# Patient Record
Sex: Male | Born: 1970 | Race: Black or African American | Hispanic: No | Marital: Single | Smoking: Current every day smoker
Health system: Southern US, Community
[De-identification: ages and names within clinical notes are randomized; demographics above are authoritative.]

---

## 2008-10-04 ENCOUNTER — Emergency Department (HOSPITAL_COMMUNITY): Admission: EM | Admit: 2008-10-04 | Discharge: 2008-10-04 | Payer: Self-pay | Admitting: Emergency Medicine

## 2009-12-08 ENCOUNTER — Emergency Department (HOSPITAL_COMMUNITY): Admission: EM | Admit: 2009-12-08 | Discharge: 2009-12-08 | Payer: Self-pay | Admitting: Emergency Medicine

## 2010-08-17 ENCOUNTER — Emergency Department (HOSPITAL_COMMUNITY)
Admission: EM | Admit: 2010-08-17 | Discharge: 2010-08-17 | Disposition: A | Discharge status: Home / Self Care | Payer: Self-pay | Attending: Emergency Medicine | Admitting: Emergency Medicine

## 2010-08-17 DIAGNOSIS — L02818 Cutaneous abscess of other sites: Secondary | ICD-10-CM | POA: Insufficient documentation

## 2010-08-19 LAB — WOUND CULTURE: Gram Stain: NONE SEEN

## 2010-08-20 ENCOUNTER — Inpatient Hospital Stay (INDEPENDENT_AMBULATORY_CARE_PROVIDER_SITE_OTHER)
Admission: RE | Admit: 2010-08-20 | Discharge: 2010-08-20 | Disposition: A | Discharge status: Home / Self Care | Payer: Self-pay | Source: Ambulatory Visit

## 2010-08-20 DIAGNOSIS — N6489 Other specified disorders of breast: Secondary | ICD-10-CM

## 2010-08-22 ENCOUNTER — Inpatient Hospital Stay (HOSPITAL_COMMUNITY)
Admission: RE | Admit: 2010-08-22 | Discharge: 2010-08-22 | Disposition: A | Discharge status: Home / Self Care | Payer: Self-pay | Source: Ambulatory Visit | Attending: Family Medicine | Admitting: Family Medicine

## 2012-12-07 ENCOUNTER — Encounter (HOSPITAL_COMMUNITY): Payer: Self-pay | Admitting: Emergency Medicine

## 2012-12-07 ENCOUNTER — Emergency Department (HOSPITAL_COMMUNITY)
Admission: EM | Admit: 2012-12-07 | Discharge: 2012-12-07 | Disposition: A | Discharge status: Home / Self Care | Payer: Self-pay | Attending: Emergency Medicine | Admitting: Emergency Medicine

## 2012-12-07 DIAGNOSIS — L239 Allergic contact dermatitis, unspecified cause: Secondary | ICD-10-CM

## 2012-12-07 DIAGNOSIS — F17200 Nicotine dependence, unspecified, uncomplicated: Secondary | ICD-10-CM | POA: Insufficient documentation

## 2012-12-07 DIAGNOSIS — L258 Unspecified contact dermatitis due to other agents: Secondary | ICD-10-CM | POA: Insufficient documentation

## 2012-12-07 DIAGNOSIS — L03811 Cellulitis of head [any part, except face]: Secondary | ICD-10-CM

## 2012-12-07 DIAGNOSIS — L02818 Cutaneous abscess of other sites: Secondary | ICD-10-CM | POA: Insufficient documentation

## 2012-12-07 MED ORDER — CEPHALEXIN 250 MG PO CAPS: 250.0000 mg | ORAL_CAPSULE | Freq: Four times a day (QID) | ORAL | Status: DC

## 2012-12-07 MED ORDER — PREDNISONE 5 MG PO TABS: 20.0000 mg | ORAL_TABLET | Freq: Every day | ORAL | Status: DC

## 2012-12-07 NOTE — ED Notes (Signed)
Pt states he had [employer] hair dye applied to his hair about 1.5 weeks ago then immediately braided his hair. Started breaking out with rash to scalp 4-5 days later. Rash is painful.

## 2012-12-07 NOTE — ED Provider Notes (Signed)
Medical screening examination/treatment/procedure(s) were performed by non-physician practitioner and as supervising physician I was immediately available for consultation/collaboration.  Dione Booze, MD 12/07/12 1158

## 2012-12-07 NOTE — ED Provider Notes (Signed)
History     CSN: [id number]  Arrival date & time 12/07/12  4098   First MD Initiated Contact with Patient 12/07/12 [phone]      No chief complaint on file.   (Consider location/radiation/quality/duration/timing/severity/associated sxs/prior treatment) HPI  Patient to the ED with complaints of allergic reaction to hair dye that happened almost 2 weeks ago. The rash developed 3-4 days afterwards diffusely all over his scalp. He admits to scratching is a significant amount and now it is hurting and draining fluids. He denies fevers or large bumps to the area. No weakness, n/v/d. He is otherwise healthy. nad vss  History reviewed. No pertinent past medical history.  History reviewed. No pertinent past surgical history.  No family history on file.  History  Substance Use Topics  . Smoking status: Current Every Day Smoker  . Smokeless tobacco: Not on file  . Alcohol Use: Yes      Review of Systems  Skin: Positive for rash.  All other systems reviewed and are negative.    Allergies  Review of patient's allergies indicates no known allergies.  Home Medications   Current Outpatient Rx  Name  Route  Sig  Dispense  Refill  . DiphenhydrAMINE HCl (BENADRYL PO)   Oral   Take 2 tablets by mouth every 6 (six) hours as needed (seasonal allergies).         . cephALEXin (KEFLEX) 250 MG capsule   Oral   Take 1 capsule (250 mg total) by mouth 4 (four) times daily.   28 capsule   0   . predniSONE (DELTASONE) 5 MG tablet   Oral   Take 4 tablets (20 mg total) by mouth daily.   42 tablet   0     12 tabs on day 1, 10 tabs on day 2, 8 tabs on day  ...     BP 124/84  Pulse 83  Temp(Src) 97.8 F (36.6 C) (Oral)  SpO2 97%  Physical Exam  Nursing note and vitals reviewed. Constitutional: He appears well-developed and well-nourished. No distress.  HENT:  Head: Normocephalic and atraumatic.  Diffuse atopic dermatitis to entire scalp with areas of plaque build up. Some of the  lesions are draining whitish discharged. No frank abscess.  Eyes: Pupils are equal, round, and reactive to light.  Neck: Normal range of motion. Neck supple.  Cardiovascular: Normal rate and regular rhythm.   Pulmonary/Chest: Effort normal.  Abdominal: Soft.  Neurological: He is alert.  Skin: Skin is warm and dry.    ED Course  Procedures (including critical care time)  Labs Reviewed - No data to display No results found.   1. Eczema, allergic   2. Cellulitis of head or scalp       MDM  Rx Prednisone for allergic reaction and keflex to secondary infection.  Fu with dermatology  Pt has been advised of the symptoms that warrant their return to the ED. Patient has voiced understanding and has agreed to follow-up with the PCP or specialist.         Dorthula Matas, PA-C 12/07/12 [phone]

## 2013-05-27 ENCOUNTER — Emergency Department (HOSPITAL_COMMUNITY)
Admission: EM | Admit: 2013-05-27 | Discharge: 2013-05-27 | Disposition: A | Discharge status: Home / Self Care | Payer: Self-pay | Attending: Emergency Medicine | Admitting: Emergency Medicine

## 2013-05-27 ENCOUNTER — Encounter (HOSPITAL_COMMUNITY): Payer: Self-pay | Admitting: Emergency Medicine

## 2013-05-27 DIAGNOSIS — K089 Disorder of teeth and supporting structures, unspecified: Secondary | ICD-10-CM | POA: Insufficient documentation

## 2013-05-27 DIAGNOSIS — F17200 Nicotine dependence, unspecified, uncomplicated: Secondary | ICD-10-CM | POA: Insufficient documentation

## 2013-05-27 DIAGNOSIS — G479 Sleep disorder, unspecified: Secondary | ICD-10-CM | POA: Insufficient documentation

## 2013-05-27 DIAGNOSIS — K0889 Other specified disorders of teeth and supporting structures: Secondary | ICD-10-CM

## 2013-05-27 MED ORDER — OXYCODONE-ACETAMINOPHEN 5-325 MG PO TABS: 1.0000 | ORAL_TABLET | Freq: Three times a day (TID) | ORAL | Status: DC | PRN

## 2013-05-27 MED ORDER — PENICILLIN V POTASSIUM 500 MG PO TABS: 500.0000 mg | ORAL_TABLET | Freq: Four times a day (QID) | ORAL | Status: AC

## 2013-05-27 NOTE — ED Notes (Signed)
C/O LEFT LOWER MOLAR PAIN X 2 DAYS.

## 2013-05-27 NOTE — ED Provider Notes (Signed)
CSN: [id number]     Arrival date & time 05/27/13  1025 History  This chart was scribed for non-physician practitioner working with Audree Camel, MD by Ashley Jacobs, ED scribe. This patient was seen in room TR05C/TR05C and the patient's care was started at 12:24 PM.  First MD Initiated Contact with Patient 05/27/13 1037     Chief Complaint  Patient presents with  . Dental Pain   (Consider location/radiation/quality/duration/timing/severity/associated sxs/prior Treatment) The history is provided by medical records and the patient. No language interpreter was used.   HPI Comments: Steven Boyle is a 42 y.o. male who presents to the Emergency Department complaining of left, lower molar pain for the past two days after a dental fracture. She states he has been unable to work due to pain and feels sharp left sided facial pain. He describes the pain as a "sharp object" stabbing sensation in his face and jaw. Pt is experiencing constant, moderate headache and jaw pain. Th pain is worse with opening and closing his jaw. He denies fever, chest pain, neck pain,neck stiffness, drainage, bleeding, difficulty swallowing, nausea, vomiting, and chills. Pt has tried Advil with temporary relief.  Pt smokes tobacco every day and does not drink alcohol. History reviewed. No pertinent past medical history. History reviewed. No pertinent past surgical history. No family history on file. History  Substance Use Topics  . Smoking status: Current Every Day Smoker  . Smokeless tobacco: Not on file  . Alcohol Use: Yes    Review of Systems  Constitutional: Negative for fever and chills.  HENT: Positive for dental problem. Negative for sore throat and trouble swallowing.   Cardiovascular: Negative for chest pain.  Gastrointestinal: Negative for nausea and vomiting.  Musculoskeletal: Negative for neck pain and neck stiffness.  Neurological: Positive for headaches.  Psychiatric/Behavioral: Positive for  sleep disturbance.  All other systems reviewed and are negative.    Allergies  Review of patient's allergies indicates no known allergies.  Home Medications   Current Outpatient Rx  Name  Route  Sig  Dispense  Refill  . ibuprofen (ADVIL) 200 MG tablet   Oral   Take 600 mg by mouth once.          BP 133/85  Pulse 72  Temp(Src) 98.4 F (36.9 C) (Oral)  Ht 5\' 11"  (1.803 m)  Wt 165 lb (74.844 kg)  BMI 23.02 kg/m2  SpO2 99% Physical Exam  Nursing note and vitals reviewed. Constitutional: He is oriented to person, place, and time. He appears well-developed and well-nourished. No distress.  HENT:  Head: Normocephalic and atraumatic.  Mouth/Throat:    Negative facial swelling  Poor dentition identified. Diagrammed left molar of the mandible region. Negative swelling, erythema, abscess or cyst formation to the mandible jaw line. Negative active drainage or bleeding noted. Pain upon palpation to left molar region. Negative trismus. Negative sublingual lesions. Uvula midline, symmetrical elevation. Negative signs of peritonsillar abscess.  Eyes: Conjunctivae and EOM are normal. Pupils are equal, round, and reactive to light. Right eye exhibits no discharge. Left eye exhibits no discharge.  Neck: Normal range of motion. Neck supple.  Negative neck stiffness Negative nuchal rigidity Negative lymphadenopathy  Cardiovascular: Normal rate, regular rhythm and normal heart sounds.  Exam reveals no friction rub.   No murmur heard. Pulses:      Radial pulses are 2+ on the right side, and 2+ on the left side.  Pulmonary/Chest: Effort normal and breath sounds normal. No respiratory distress. He has no  wheezes. He has no rales.  Musculoskeletal: Normal range of motion.  Lymphadenopathy:    He has no cervical adenopathy.  Neurological: He is alert and oriented to person, place, and time. No cranial nerve deficit. He exhibits normal muscle tone. Coordination normal.  Cranial nerves III  through XII grossly intact  Skin: Skin is warm and dry. No rash noted. He is not diaphoretic. No erythema.  Psychiatric: He has a normal mood and affect. His behavior is normal. Thought content normal.    ED Course  Procedures (including critical care time) DIAGNOSTIC STUDIES: Oxygen Saturation is 99% on room air, normal by my interpretation.    COORDINATION OF CARE: 12:28 PM Discussed course of care with pt. Pt understands and agrees.  Labs Review Labs Reviewed - No data to display Imaging Review No results found.  EKG Interpretation   None       MDM   1. Pain, dental    Filed Vitals:   05/27/13 1038  BP: 133/85  Pulse: 72  Temp: 98.4 F (36.9 C)  TempSrc: Oral  Height: 5\' 11"  (1.803 m)  Weight: 165 lb (74.844 kg)  SpO2: 99%   I personally performed the services described in this documentation, which was scribed in my presence. The recorded information has been reviewed and is accurate.  Patient presenting to emergency department with dental pain that has been ongoing for the past 2 days. Patient reports he cracked his tooth approximately 4 days ago. Has not seen a dentist in one year. Alert and oriented. Negative facial swelling identified. Poor dentition. Diagrammed left molar of the mandible jawline. Negative swelling, erythema, inflammation or deformities noted to the jaw line. Negative trismus noted. Negative sublingual lesions. Negative neck stiffness, negative nuchal rigidity. Toothache secondary to diagrammed tooth, and process of decaying.  Patient stable, afebrile. Negative active abscess, negative findings for periapical abscess. Doubt peritonsillar abscess. Doubt Ludwig's angina. Patient stable, afebrile. Discharge patient with antibiotics and pain medications discussed course, precautions, disposal technique. Referred patient to dentist, numerous dental resources given. Discussed with patient to rest and stay hydrated. Discussed with patient to apply hot  compressions. Discussed with patient to closely monitor symptoms if symptoms are to worsen or change report back to emergency department - strict return instructions given. Patient agreed to plan of care, understood, all questions answered   Raymon Mutton, PA-C 05/27/13 2157

## 2013-05-28 NOTE — ED Provider Notes (Signed)
Medical screening examination/treatment/procedure(s) were performed by non-physician practitioner and as supervising physician I was immediately available for consultation/collaboration.  EKG Interpretation   None         Audree Camel, MD 05/28/13 [phone]

## 2019-06-11 ENCOUNTER — Other Ambulatory Visit: Payer: Self-pay

## 2019-06-11 ENCOUNTER — Ambulatory Visit (INDEPENDENT_AMBULATORY_CARE_PROVIDER_SITE_OTHER): Payer: Self-pay

## 2019-06-11 ENCOUNTER — Encounter (HOSPITAL_COMMUNITY): Payer: Self-pay

## 2019-06-11 ENCOUNTER — Ambulatory Visit (HOSPITAL_COMMUNITY)
Admission: EM | Admit: 2019-06-11 | Discharge: 2019-06-11 | Disposition: A | Discharge status: Home / Self Care | Payer: Self-pay | Attending: Family Medicine | Admitting: Family Medicine

## 2019-06-11 ENCOUNTER — Emergency Department (HOSPITAL_COMMUNITY): Payer: Self-pay

## 2019-06-11 ENCOUNTER — Emergency Department (HOSPITAL_COMMUNITY)
Admission: EM | Admit: 2019-06-11 | Discharge: 2019-06-11 | Disposition: A | Discharge status: Home / Self Care | Payer: Self-pay | Attending: Emergency Medicine | Admitting: Emergency Medicine

## 2019-06-11 DIAGNOSIS — S82141A Displaced bicondylar fracture of right tibia, initial encounter for closed fracture: Secondary | ICD-10-CM

## 2019-06-11 DIAGNOSIS — F17200 Nicotine dependence, unspecified, uncomplicated: Secondary | ICD-10-CM | POA: Insufficient documentation

## 2019-06-11 DIAGNOSIS — Y9289 Other specified places as the place of occurrence of the external cause: Secondary | ICD-10-CM | POA: Insufficient documentation

## 2019-06-11 DIAGNOSIS — W01198A Fall on same level from slipping, tripping and stumbling with subsequent striking against other object, initial encounter: Secondary | ICD-10-CM | POA: Insufficient documentation

## 2019-06-11 DIAGNOSIS — Y9301 Activity, walking, marching and hiking: Secondary | ICD-10-CM | POA: Insufficient documentation

## 2019-06-11 DIAGNOSIS — Y998 Other external cause status: Secondary | ICD-10-CM | POA: Insufficient documentation

## 2019-06-11 MED ORDER — HYDROCODONE-ACETAMINOPHEN 5-325 MG PO TABS
2.0000 | ORAL_TABLET | Freq: Once | ORAL | Status: AC
  Administered 2019-06-11: 2 via ORAL
  Filled 2019-06-11: qty 2

## 2019-06-11 MED ORDER — HYDROCODONE-ACETAMINOPHEN 5-325 MG PO TABS: 1.0000 | ORAL_TABLET | ORAL | 0 refills | Status: DC | PRN

## 2019-06-11 MED ORDER — IBUPROFEN 800 MG PO TABS
800.0000 mg | ORAL_TABLET | Freq: Once | ORAL | Status: AC
Start: 2019-06-11 — End: 2019-06-11
  Administered 2019-06-11: 12:00:00 800 mg via ORAL

## 2019-06-11 MED ORDER — IBUPROFEN 800 MG PO TABS
ORAL_TABLET | ORAL | Status: AC
  Filled 2019-06-11: qty 1

## 2019-06-11 NOTE — ED Notes (Signed)
Ortho to place knee immobilizer when pt returns from CT.

## 2019-06-11 NOTE — ED Provider Notes (Signed)
[facility]    CSN: [id number] Arrival date & time: 06/11/19  1107      History   Chief Complaint Chief Complaint  Patient presents with  . Appointment    11:10  . Knee Injury    HPI MERCED HANNERS is a 48 y.o. male.   Herma Ard presents with complaints of right knee pain after a fall last night. States he was going down the stairs and felt like his knee gave out , causing him to then fall and land on his right knee. Hasn't been able to bear weight since, due to pain. Denies any previous knee injury. Took tylenol last night which didn't help. Pain to entire knee. 8/10. Swelling present. This morning had tingling to foot, this has resolved. Last ate at 7p last evening. ROS per HPI, negative if not otherwise mentioned.   ROS per HPI, negative if not otherwise mentioned.      History reviewed. No pertinent past medical history.  There are no active problems to display for this patient.   History reviewed. No pertinent surgical history.     Home Medications    Prior to Admission medications   Medication Sig Start Date End Date Taking? Authorizing Provider  ibuprofen (ADVIL) 200 MG tablet Take 600 mg by mouth once.    [provider]  oxyCODONE-acetaminophen (PERCOCET/ROXICET) 5-325 MG per tablet Take 1 tablet by mouth every 8 (eight) hours as needed for severe pain. 05/27/13   Jamse Mead, PA-C    Family History History reviewed. No pertinent family history.  Social History Social History   Tobacco Use  . Smoking status: Current Every Day Smoker  . Smokeless tobacco: Never Used  Substance Use Topics  . Alcohol use: Yes  . Drug use: No     Allergies   Patient has no known allergies.   Review of Systems Review of Systems   Physical Exam Triage Vital Signs ED Triage Vitals  Enc Vitals Group     BP 06/11/19 1129 119/84     Pulse Rate 06/11/19 1129 78     Resp 06/11/19 1129 16     Temp 06/11/19 1129 98.6 F (37 C)      Temp Source 06/11/19 1129 Oral     SpO2 06/11/19 1129 98 %     Weight --      Height --      Head Circumference --      Peak Flow --      Pain Score 06/11/19 1128 9     Pain Loc --      Pain Edu? --      Excl. in Hand? --    No data found.  Updated Vital Signs BP 119/84 (BP Location: Left Arm)   Pulse 78   Temp 98.6 F (37 C) (Oral)   Resp 16   SpO2 98%   Visual Acuity Right Eye Distance:   Left Eye Distance:   Bilateral Distance:    Right Eye Near:   Left Eye Near:    Bilateral Near:     Physical Exam Constitutional:      Appearance: He is well-developed.  Cardiovascular:     Rate and Rhythm: Normal rate.  Pulmonary:     Effort: Pulmonary effort is normal.  Musculoskeletal:     Right knee: He exhibits decreased range of motion, swelling, effusion and bony tenderness. He exhibits no ecchymosis and no erythema. Tenderness found. Medial joint line, lateral joint line and  patellar tendon tenderness noted.     Comments: Generalized tenderness to knee with swelling present; pain with extension, more comfort in flexion; foot sensation intact  Skin:    General: Skin is warm and dry.  Neurological:     Mental Status: He is alert and oriented to person, place, and time.      UC Treatments / Results  Labs (all labs ordered are listed, but only abnormal results are displayed) Labs Reviewed - No data to display  EKG   Radiology Dg Knee Complete 4 Views Right  Result Date: 06/11/2019 CLINICAL DATA:  Fall downstairs.  Knee pain. EXAM: RIGHT KNEE - COMPLETE 4+ VIEW COMPARISON:  None. FINDINGS: Depressed fracture of the lateral tibial plateau. Moderate suprapatellar joint effusion noted. IMPRESSION: Depressed fracture of the lateral tibial plateau with joint effusion. Electronically Signed   By: Genevive Bi M.D.   On: 06/11/2019 12:14    Procedures Procedures (including critical care time)  Medications Ordered in UC Medications  ibuprofen (ADVIL) tablet 800  mg (800 mg Oral Given 06/11/19 1146)  ibuprofen (ADVIL) 800 MG tablet (has no administration in time range)    Initial Impression / Assessment and Plan / UC Course  I have reviewed the triage vital signs and the nursing notes.  Pertinent labs & imaging results that were available during my care of the patient were reviewed by me and considered in my medical decision making (see chart for details).     Right lateral tibial plateau fracture on xray.   Page to on call [facility] 12:35. Second page out at 1305. Call to cell and voicmail left for PA Kayla McKenzie at 1340.   Page out to [facility] at approximately 1415, with return call from Dr. Roda Shutters who is agreeable to assume care for patient. He is requesting CT now, patient to go to the ER. Dr. Roda Shutters will provide care following this CT and referral to him should be made. Patient notified of plan of care and agreeable. Nursing staff assisting patient via wheelchair down to ER.    Final Clinical Impressions(s) / UC Diagnoses   Final diagnoses:  Closed fracture of right tibial plateau, initial encounter   Discharge Instructions   None    ED Prescriptions    None     PDMP not reviewed this encounter.   Georgetta Haber, NP 06/11/19 1430

## 2019-06-11 NOTE — ED Provider Notes (Signed)
[facility] EMERGENCY DEPARTMENT Provider Note   CSN: [id number] Arrival date & time: 06/11/19  1433     History   Chief Complaint No chief complaint on file.   HPI Steven Boyle is a 48 y.o. male.     Patient reports he fell on a step and hit his right knee patient was seen at urgent care before coming to the emergency department.  The provider at urgent care spoke with Dr. Erlinda Hong.  Requested that patient come to the emergency department for a CT scan of his knee.  He complains of continued pain he denies any other areas of injuries  The history is provided by the patient. No language interpreter was used.    No past medical history on file.  There are no active problems to display for this patient.   No past surgical history on file.      Home Medications    Prior to Admission medications   Medication Sig Start Date End Date Taking? Authorizing Provider  HYDROcodone-acetaminophen (NORCO/VICODIN) 5-325 MG tablet Take 1 tablet by mouth every 4 (four) hours as needed. 06/11/19   Fransico Meadow, PA-C    Family History No family history on file.  Social History Social History   Tobacco Use  . Smoking status: Current Every Day Smoker  . Smokeless tobacco: Never Used  Substance Use Topics  . Alcohol use: Yes  . Drug use: No     Allergies   Patient has no known allergies.   Review of Systems Review of Systems  Musculoskeletal: Positive for joint swelling.  All other systems reviewed and are negative.    Physical Exam Updated Vital Signs BP 138/85   Pulse 82   Temp 98.3 F (36.8 C) (Oral)   Resp 19   SpO2 99%   Physical Exam Vitals signs and nursing note reviewed.  Constitutional:      Appearance: He is well-developed.  HENT:     Head: Normocephalic and atraumatic.  Eyes:     Conjunctiva/sclera: Conjunctivae normal.  Neck:     Musculoskeletal: Neck supple.  Cardiovascular:     Rate and Rhythm: Normal rate and regular  rhythm.     Heart sounds: No murmur.  Pulmonary:     Effort: Pulmonary effort is normal. No respiratory distress.     Breath sounds: Normal breath sounds.  Abdominal:     Tenderness: There is no abdominal tenderness.  Musculoskeletal:        General: Swelling and tenderness present.  Skin:    General: Skin is warm and dry.  Neurological:     General: No focal deficit present.     Mental Status: He is alert.  Psychiatric:        Mood and Affect: Mood normal.      ED Treatments / Results  Labs (all labs ordered are listed, but only abnormal results are displayed) Labs Reviewed - No data to display  EKG None  Radiology Ct Knee Right Wo Contrast  Result Date: 06/11/2019 CLINICAL DATA:  Tibial plateau fracture EXAM: CT OF THE RIGHT KNEE WITHOUT CONTRAST TECHNIQUE: Multidetector CT imaging of the right knee was performed according to the standard protocol. Multiplanar CT image reconstructions were also generated. COMPARISON:  X-ray 06/11/2019 FINDINGS: Bones/Joint/Cartilage Acute comminuted fracture of the lateral tibial plateau with up to 12 mm of articular-surface depression (series 7, image 67). There is a vertical component of the peripheral aspect of the lateral tibial plateau extending into the  proximal metaphysis without significant displacement. Nondisplaced fracture component at the central aspect of the posterior tibial plateau (series 3, image 90) near the tibial attachment of the PCL. Nondisplaced fracture components involve the lateral tibial spine. Fracture line closely approximates the proximal tibiofibular joint without definite involvement. There is a large knee joint lipohemarthrosis. Small amount of fluid/blood products within a Baker's cyst. The visualized proximal fibula, distal femur, and patella are intact without fracture. There is a large subchondral cyst within the lateral patellar facet. Ligaments Suboptimally assessed by CT. Muscles and Tendons Unremarkable  muscle bulk. Tendinous structures about the knee appear grossly intact. Soft tissues Soft tissue edema at the fracture site. There is edema and fluid within Hoffa's fat. IMPRESSION: 1. Acute comminuted fracture of the lateral tibial plateau with up to 12 mm of articular-surface depression. 2. Nondisplaced fracture component at the central aspect of the posterior tibial plateau near the tibial attachment of the PCL. Correlate for associated ligamentous injury. 3. Large knee joint lipohemarthrosis. Small amount of fluid/blood products within a Baker's cyst. 4. Large subchondral cyst within the lateral patellar facet. Electronically Signed   By: Duanne Guess M.D.   On: 06/11/2019 15:24   Dg Knee Complete 4 Views Right  Result Date: 06/11/2019 CLINICAL DATA:  Fall downstairs.  Knee pain. EXAM: RIGHT KNEE - COMPLETE 4+ VIEW COMPARISON:  None. FINDINGS: Depressed fracture of the lateral tibial plateau. Moderate suprapatellar joint effusion noted. IMPRESSION: Depressed fracture of the lateral tibial plateau with joint effusion. Electronically Signed   By: Genevive Bi M.D.   On: 06/11/2019 12:14    Procedures Procedures (including critical care time)  Medications Ordered in ED Medications  HYDROcodone-acetaminophen (NORCO/VICODIN) 5-325 MG per tablet 2 tablet (2 tablets Oral Given 06/11/19 1622)     Initial Impression / Assessment and Plan / ED Course  I have reviewed the triage vital signs and the nursing notes.  Pertinent labs & imaging results that were available during my care of the patient were reviewed by me and considered in my medical decision making (see chart for details).        MDM: Spoke to Dr. Roda Shutters and reviewed CT scan results.  Advised patient that he will need to see the orthopedist in the office on Tuesday patient is placed in a knee immobilizer and given crutches patient is given a prescription for pain medicine he is advised to apply ice to the area of swelling he is to  keep his knee elevated.  He is advised to return to the emergency department if any problems  Final Clinical Impressions(s) / ED Diagnoses   Final diagnoses:  Closed fracture of right tibial plateau, initial encounter    ED Discharge Orders         Ordered    HYDROcodone-acetaminophen (NORCO/VICODIN) 5-325 MG tablet  Every 4 hours PRN     06/11/19 1612        An After Visit Summary was printed and given to the patient.    Elson Areas, [place] 06/11/19 1819    Gwyneth Sprout, MD 06/11/19 Corky Crafts

## 2019-06-11 NOTE — Progress Notes (Signed)
Orthopedic Tech Progress Note Patient Details:  ASHER TORPEY 1970/12/27 [id number]  Ortho Devices Type of Ortho Device: Crutches, Knee Immobilizer Ortho Device/Splint Location: LRE Ortho Device/Splint Interventions: Adjustment, Application, Ordered   Post Interventions Patient Tolerated: Ambulated well Instructions Provided: Poper ambulation with device, Care of device, Adjustment of device   Janit Pagan 06/11/2019, 4:52 PM

## 2019-06-11 NOTE — ED Triage Notes (Signed)
Pt states he fell on the stairs last night and injured his right knee. Pt states his right knee is swollen and painful.

## 2019-06-11 NOTE — ED Notes (Signed)
Patient transported to CT 

## 2019-06-11 NOTE — ED Notes (Signed)
Ortho Tech at bedside.  

## 2019-06-11 NOTE — ED Notes (Signed)
Patient is being discharged from the [facility] and sent to the Emergency Department via wheelchair by staff. Per Lanelle Bal NP, patient is stable but in need of higher level of care due to fx. Patient is aware and verbalizes understanding of plan of care.  Vitals:   06/11/19 1129  BP: 119/84  Pulse: 78  Resp: 16  Temp: 98.6 F (37 C)  SpO2: 98%

## 2019-06-11 NOTE — ED Triage Notes (Signed)
Pt sent here from UC for a CT scan of right leg.  Pt fell last night  and injured his right leg.  Swelling noted to right knee.

## 2019-06-11 NOTE — ED Notes (Signed)
Patient verbalizes understanding of discharge instructions. Opportunity for questioning and answers were provided. Armband removed by staff, pt discharged from ED.  

## 2019-06-11 NOTE — Discharge Instructions (Signed)
ICe to area of swelling.  Elevate knee, Use crutches

## 2019-06-15 ENCOUNTER — Encounter: Payer: Self-pay | Admitting: Orthopaedic Surgery

## 2019-06-15 ENCOUNTER — Other Ambulatory Visit: Payer: Self-pay

## 2019-06-15 ENCOUNTER — Ambulatory Visit (INDEPENDENT_AMBULATORY_CARE_PROVIDER_SITE_OTHER): Payer: Self-pay | Admitting: Orthopaedic Surgery

## 2019-06-15 DIAGNOSIS — S82121A Displaced fracture of lateral condyle of right tibia, initial encounter for closed fracture: Secondary | ICD-10-CM | POA: Insufficient documentation

## 2019-06-15 DIAGNOSIS — M1711 Unilateral primary osteoarthritis, right knee: Secondary | ICD-10-CM | POA: Insufficient documentation

## 2019-06-15 MED ORDER — HYDROCODONE-ACETAMINOPHEN 5-325 MG PO TABS: 1.0000 | ORAL_TABLET | Freq: Every day | ORAL | 0 refills | Status: DC | PRN

## 2019-06-15 NOTE — Progress Notes (Addendum)
Office Visit Note   Patient: Steven Boyle           Date of Birth: [birth year]           MRN: [id number] Visit Date: 06/15/2019              Requested by: No referring provider defined for this encounter. PCP: Patient, No Pcp Per   Assessment & Plan: Visit Diagnoses:  1. Closed fracture of lateral portion of right tibial plateau, initial encounter   2. Primary osteoarthritis of right knee     Plan: My impression is acute lateral split depression tibial plateau fracture with underlying moderate DJD.  We had a lengthy discussion today about his recent injury and reviewed his x-rays and CT scan which show the acute fracture as well as significant DJD.  Based on the discussion that he had pre-existing DJD symptoms and the likelihood that this injury will result in additional posttraumatic arthritis he has elected to undergo a total knee replacement instead of surgical repair.  We reviewed the risk benefits alternatives to both surgical options today.  All questions answered to his satisfaction.  Given the severity of the injury and the limitations that he will have he will need to be out of work for at least 12 weeks possibly more.  Unfortunately this injury and the recovery will cause him to be out of work for an extended period of time.  I would like to recheck his range of motion in 2 weeks.  Today we placed him in a hinged knee brace and continue nonweightbearing with crutches. Total face to face encounter time was greater than 45 minutes and over half of this time was spent in counseling and/or coordination of care.  Follow-Up Instructions: Return in about 2 weeks (around 06/29/2019).   Orders:  No orders of the defined types were placed in this encounter.  Meds ordered this encounter  Medications  . HYDROcodone-acetaminophen (NORCO) 5-325 MG tablet    Sig: Take 1-2 tablets by mouth daily as needed.    Dispense:  20 tablet    Refill:  0      Procedures: No procedures performed    Clinical Data: No additional findings.   Subjective: Chief Complaint  Patient presents with  . Right Knee - Pain    Mr. Lamere is an ER follow-up for a right tibial plateau fracture.  He is a 48 year old gentleman who injured his right tibial plateau on 06/11/2019 when he tripped on the stairs and his knee buckled inward.  He states he had immediate pain and swelling inability to weight-bear.  He presented to the ED and x-rays revealed a splint depression lateral tibial plateau fracture.  He states that he has had prior knee pain consistent with degenerative joint disease.  He is currently in a knee immobilizer and crutches and nonweightbearing.  He recently started working as a Estate agent.  He does have swelling in his knee joint.   Review of Systems  Constitutional: Negative.   All other systems reviewed and are negative.    Objective: Vital Signs: There were no vitals taken for this visit.  Physical Exam Vitals signs and nursing note reviewed.  Constitutional:      Appearance: He is well-developed.  HENT:     Head: Normocephalic and atraumatic.  Eyes:     Pupils: Pupils are equal, round, and reactive to light.  Neck:     Musculoskeletal: Neck supple.  Pulmonary:     Effort:  Pulmonary effort is normal.  Abdominal:     Palpations: Abdomen is soft.  Musculoskeletal: Normal range of motion.  Skin:    General: Skin is warm.  Neurological:     Mental Status: He is alert and oriented to person, place, and time.  Psychiatric:        Behavior: Behavior normal.        Thought Content: Thought content normal.        Judgment: Judgment normal.     Ortho Exam Right knee exam shows a large joint effusion.  Range of motion is slightly limited secondary to this.  Collaterals are stable.  Cruciates are grossly intact. Specialty Comments:  No specialty comments available.  Imaging: No results found.   PMFS History: Patient Active Problem List   Diagnosis Date  Noted  . Closed fracture of lateral portion of right tibial plateau 06/15/2019  . Primary osteoarthritis of right knee 06/15/2019   History reviewed. No pertinent past medical history.  History reviewed. No pertinent family history.  History reviewed. No pertinent surgical history. Social History   Occupational History  . Not on file  Tobacco Use  . Smoking status: Current Every Day Smoker  . Smokeless tobacco: Never Used  Substance and Sexual Activity  . Alcohol use: Yes  . Drug use: No  . Sexual activity: Not on file

## 2019-06-29 ENCOUNTER — Ambulatory Visit: Payer: Self-pay | Admitting: Orthopaedic Surgery

## 2019-06-29 DIAGNOSIS — M1711 Unilateral primary osteoarthritis, right knee: Secondary | ICD-10-CM

## 2019-06-29 DIAGNOSIS — S82121A Displaced fracture of lateral condyle of right tibia, initial encounter for closed fracture: Secondary | ICD-10-CM

## 2019-08-03 ENCOUNTER — Ambulatory Visit: Payer: Self-pay | Admitting: Orthopaedic Surgery

## 2020-04-20 ENCOUNTER — Other Ambulatory Visit: Payer: Self-pay

## 2020-04-20 ENCOUNTER — Encounter (HOSPITAL_COMMUNITY): Payer: Self-pay | Admitting: *Deleted

## 2020-04-20 ENCOUNTER — Ambulatory Visit (HOSPITAL_COMMUNITY)
Admission: EM | Admit: 2020-04-20 | Discharge: 2020-04-20 | Disposition: A | Discharge status: Home / Self Care | Payer: Self-pay | Attending: Internal Medicine | Admitting: Internal Medicine

## 2020-04-20 DIAGNOSIS — K047 Periapical abscess without sinus: Secondary | ICD-10-CM

## 2020-04-20 MED ORDER — AMOXICILLIN-POT CLAVULANATE 875-125 MG PO TABS: 1.0000 | ORAL_TABLET | Freq: Two times a day (BID) | ORAL | 0 refills | Status: AC

## 2020-04-20 MED ORDER — HYDROCODONE-ACETAMINOPHEN 5-325 MG PO TABS: 1.0000 | ORAL_TABLET | Freq: Four times a day (QID) | ORAL | 0 refills | Status: AC | PRN

## 2020-04-20 NOTE — Discharge Instructions (Addendum)
Please take medications as directed Low cost dental resources have been included in your discharge packet Do well to reach out to any of those practices to get dental care.

## 2020-04-20 NOTE — ED Provider Notes (Signed)
[facility]    CSN: [id number] Arrival date & time: 04/20/20  1017      History   Chief Complaint Chief Complaint  Patient presents with  . Dental Pain    HPI Steven Boyle is a 49 y.o. male with poor dental hygiene comes to the urgent care with 2-day history of severe dental pain and gum swelling.  Patient says that the pain is throbbing, severe currently 8 out of 10.  No known relieving factors.  Aggravated by chewing.  Patient has poor dental hygiene and had some loosening of his teeth over the past year and a half.  No fever or chills.[name]   HPI  History reviewed. No pertinent past medical history.  Patient Active Problem List   Diagnosis Date Noted  . Closed fracture of lateral portion of right tibial plateau 06/15/2019  . Primary osteoarthritis of right knee 06/15/2019    History reviewed. No pertinent surgical history.     Home Medications    Prior to Admission medications   Medication Sig Start Date End Date Taking? Authorizing Provider  ibuprofen (ADVIL) 200 MG tablet Take 200 mg by mouth every 6 (six) hours as needed.   Yes [provider]  amoxicillin-clavulanate (AUGMENTIN) 875-125 MG tablet Take 1 tablet by mouth every 12 (twelve) hours. 04/20/20   Steven Boyle, Steven Mccreedy, MD  HYDROcodone-acetaminophen (NORCO) 5-325 MG tablet Take 1 tablet by mouth every 6 (six) hours as needed for moderate pain or severe pain. 04/20/20   Steven Boyle, Steven Mccreedy, MD    Family History History reviewed. No pertinent family history.  Social History Social History   Tobacco Use  . Smoking status: Current Every Day Smoker    Types: Cigars  . Smokeless tobacco: Never Used  Vaping Use  . Vaping Use: Never used  Substance Use Topics  . Alcohol use: Yes    Alcohol/week: 4.0 standard drinks    Types: 4 Cans of beer per week  . Drug use: No     Allergies   Patient has no known allergies.   Review of Systems Review of Systems  Constitutional: Negative.     HENT: Positive for dental problem. Negative for sore throat.   Respiratory: Negative.   Neurological: Positive for headaches.     Physical Exam Triage Vital Signs ED Triage Vitals  Enc Vitals Group     BP 04/20/20 1104 127/79     Pulse Rate 04/20/20 1104 76     Resp 04/20/20 1104 16     Temp 04/20/20 1104 98.3 F (36.8 C)     Temp Source 04/20/20 1104 Oral     SpO2 04/20/20 1104 100 %     Weight --      Height --      Head Circumference --      Peak Flow --      Pain Score 04/20/20 1102 8     Pain Loc --      Pain Edu? --      Excl. in GC? --    No data found.  Updated Vital Signs BP 127/79 (BP Location: Right Arm)   Pulse 76   Temp 98.3 F (36.8 C) (Oral)   Resp 16   SpO2 100%   Visual Acuity Right Eye Distance:   Left Eye Distance:   Bilateral Distance:    Right Eye Near:   Left Eye Near:    Bilateral Near:     Physical Exam Vitals and nursing note reviewed.  Constitutional:      General: He is in acute distress.     Appearance: Normal appearance. He is not ill-appearing.  HENT:     Mouth/Throat:     Comments: Poor dental hygiene: Several teeth are loose.  Several dental caries.  Gum swelling in multiple areas in both the upper and lower jaws. Cardiovascular:     Pulses: Normal pulses.     Heart sounds: Normal heart sounds.  Pulmonary:     Effort: Pulmonary effort is normal.     Breath sounds: Normal breath sounds.  Neurological:     Mental Status: He is alert.      UC Treatments / Results  Labs (all labs ordered are listed, but only abnormal results are displayed) Labs Reviewed - No data to display  EKG   Radiology No results found.  Procedures Procedures (including critical care time)  Medications Ordered in UC Medications - No data to display  Initial Impression / Assessment and Plan / UC Course  I have reviewed the triage vital signs and the nursing notes.  Pertinent labs & imaging results that were available during my care  of the patient were reviewed by me and considered in my medical decision making (see chart for details).    1.  Multiple dental infection: Augmentin 875-125 mg orally twice daily for 7 days Hydrocodone-acetaminophen 5-325 mg tablets every 6 hours as needed for pain Patient is strongly encouraged to find low-cost dental care practice to have dental work done Return precautions given. Final Clinical Impressions(s) / UC Diagnoses   Final diagnoses:  Dental infection     Discharge Instructions     Please take medications as directed Low cost dental resources have been included in your discharge packet Do well to reach out to any of those practices to get dental care.   ED Prescriptions    Medication Sig Dispense Auth. Provider   HYDROcodone-acetaminophen (NORCO) 5-325 MG tablet Take 1 tablet by mouth every 6 (six) hours as needed for moderate pain or severe pain. 15 tablet Steven Boyle, Steven Mccreedy, MD   amoxicillin-clavulanate (AUGMENTIN) 875-125 MG tablet Take 1 tablet by mouth every 12 (twelve) hours. 14 tablet Steven Boyle, Steven Mccreedy, MD     I have reviewed the PDMP during this encounter.   Steven Jansky, MD 04/21/20 [id number]

## 2020-04-20 NOTE — ED Triage Notes (Signed)
Patient in with complaints of dental pain to the bottom front teeth. Patient states states that 2 of the front teeth on the bottom fell out a 1 and 1/2 ago. Patient has a tooth in the front that is loose at this time. Patient states that he feels like his gum is swollen on right side back teeth from a tooth that had broken off. Patient has been taken Ibuprofen 800 mg and last dose was approximately 2 hours ago.

## 2020-06-29 ENCOUNTER — Encounter (HOSPITAL_COMMUNITY): Payer: Self-pay | Admitting: Emergency Medicine

## 2020-06-29 ENCOUNTER — Ambulatory Visit (HOSPITAL_COMMUNITY)
Admission: EM | Admit: 2020-06-29 | Discharge: 2020-06-29 | Disposition: A | Discharge status: Home / Self Care | Payer: BC Managed Care – PPO

## 2020-06-29 ENCOUNTER — Other Ambulatory Visit: Payer: Self-pay

## 2020-06-29 DIAGNOSIS — J069 Acute upper respiratory infection, unspecified: Secondary | ICD-10-CM | POA: Diagnosis not present

## 2020-06-29 NOTE — ED Triage Notes (Signed)
Seen by provider

## 2020-06-29 NOTE — ED Notes (Signed)
Notified laura, np of patient request not to take covid test today

## 2020-06-29 NOTE — ED Provider Notes (Signed)
[facility]    CSN: [id number] Arrival date & time: 06/29/20  1021      History   Chief Complaint Chief Complaint  Patient presents with  . URI    HPI Steven Boyle is a 49 y.o. male presenting for cold symptoms for 5 days. He states that he's experiencing productive cough, body aches, chills, and sore throat. He's been treating his symptoms at home with Theraflu and Mucinex, and is improving on his own. His work wanted him to be seen, so he presented here today. Denies fevers, n/v/d, shortness of breath, chest pain, congestion, facial pain, teeth pain, headaches, loss of taste/smell, swollen lymph nodes, ear pain.    HPI  History reviewed. No pertinent past medical history.  Patient Active Problem List   Diagnosis Date Noted  . Closed fracture of lateral portion of right tibial plateau 06/15/2019  . Primary osteoarthritis of right knee 06/15/2019    History reviewed. No pertinent surgical history.     Home Medications    Prior to Admission medications   Medication Sig Start Date End Date Taking? Authorizing Provider  amoxicillin-clavulanate (AUGMENTIN) 875-125 MG tablet Take 1 tablet by mouth every 12 (twelve) hours. 04/20/20   Lamptey, Britta Mccreedy, MD  HYDROcodone-acetaminophen (NORCO) 5-325 MG tablet Take 1 tablet by mouth every 6 (six) hours as needed for moderate pain or severe pain. 04/20/20   LampteyBritta Mccreedy, MD  ibuprofen (ADVIL) 200 MG tablet Take 200 mg by mouth every 6 (six) hours as needed.    [provider]    Family History No family history on file.  Social History Social History   Tobacco Use  . Smoking status: Current Every Day Smoker    Types: Cigars  . Smokeless tobacco: Never Used  Vaping Use  . Vaping Use: Never used  Substance Use Topics  . Alcohol use: Yes    Alcohol/week: 4.0 standard drinks    Types: 4 Cans of beer per week  . Drug use: No     Allergies   Patient has no known allergies.   Review of  Systems Review of Systems  Constitutional: Positive for chills. Negative for appetite change and fever.  HENT: Positive for congestion. Negative for ear pain, sinus pressure, sinus pain and sore throat.   Respiratory: Negative for cough, chest tightness, shortness of breath and wheezing.   Cardiovascular: Negative for chest pain.  Gastrointestinal: Negative for abdominal pain, diarrhea, nausea and vomiting.  Musculoskeletal: Positive for myalgias.  All other systems reviewed and are negative.    Physical Exam Triage Vital Signs ED Triage Vitals [06/29/20 1317]  Enc Vitals Group     BP 122/79     Pulse Rate 77     Resp 16     Temp 98.7 F (37.1 C)     Temp Source Oral     SpO2 100 %     Weight      Height      Head Circumference      Peak Flow      Pain Score      Pain Loc      Pain Edu?      Excl. in GC?    No data found.  Updated Vital Signs BP 122/79 (BP Location: Left Arm)   Pulse 77   Temp 98.7 F (37.1 C) (Oral)   Resp 16   SpO2 100%   Visual Acuity Right Eye Distance:   Left Eye Distance:   Bilateral  Distance:    Right Eye Near:   Left Eye Near:    Bilateral Near:     Physical Exam Vitals reviewed.  Constitutional:      General: He is not in acute distress.    Appearance: Normal appearance. He is not ill-appearing.  HENT:     Head: Normocephalic and atraumatic.     Right Ear: Hearing, tympanic membrane, ear canal and external ear normal. No swelling or tenderness. There is no impacted cerumen. No mastoid tenderness. Tympanic membrane is not perforated, erythematous, retracted or bulging.     Left Ear: Hearing, tympanic membrane, ear canal and external ear normal. No swelling or tenderness. There is no impacted cerumen. No mastoid tenderness. Tympanic membrane is not perforated, erythematous, retracted or bulging.     Nose:     Right Sinus: No maxillary sinus tenderness or frontal sinus tenderness.     Left Sinus: No maxillary sinus tenderness or  frontal sinus tenderness.     Mouth/Throat:     Mouth: Mucous membranes are moist.     Pharynx: Uvula midline. No oropharyngeal exudate or posterior oropharyngeal erythema.     Tonsils: No tonsillar exudate.  Cardiovascular:     Rate and Rhythm: Normal rate and regular rhythm.     Heart sounds: Normal heart sounds.  Pulmonary:     Breath sounds: Normal breath sounds and air entry.  Abdominal:     General: Bowel sounds are normal.     Palpations: Abdomen is soft.     Tenderness: There is no abdominal tenderness. There is no right CVA tenderness, left CVA tenderness, guarding or rebound. Negative signs include Murphy's sign, Rovsing's sign and McBurney's sign.  Lymphadenopathy:     Cervical: No cervical adenopathy.  Neurological:     General: No focal deficit present.     Mental Status: He is alert and oriented to person, place, and time.  Psychiatric:        Attention and Perception: Attention and perception normal.        Mood and Affect: Mood and affect normal.        Behavior: Behavior is cooperative.      UC Treatments / Results  Labs (all labs ordered are listed, but only abnormal results are displayed) Labs Reviewed - No data to display  EKG   Radiology No results found.  Procedures Procedures (including critical care time)  Medications Ordered in UC Medications - No data to display  Initial Impression / Assessment and Plan / UC Course  I have reviewed the triage vital signs and the nursing notes.  Pertinent labs & imaging results that were available during my care of the patient were reviewed by me and considered in my medical decision making (see chart for details).     Pt is satisfied with control of symptoms on his OTC Mucinex and Theraflu; continue this at home. Discussed that I cannot clear him for work until his covid test comes back negative. He states that he will return to clinic in 3 days for test, and declines covid/influenza tests today.  Final  Clinical Impressions(s) / UC Diagnoses   Final diagnoses:  Acute upper respiratory infection     Discharge Instructions     Continue using your TheraFlu and Mucinex at home. We will call you if the result of your COVID or influenza tests are positive.   Seek medical care if you experience chest pain, shortness of breath, worsening fevers/chills, confusion.     ED Prescriptions  None     PDMP not reviewed this encounter.   Rhys Martini, PA-C 06/29/20 1405

## 2020-06-29 NOTE — Discharge Instructions (Addendum)
Continue using your TheraFlu and Mucinex at home. We will call you if the result of your COVID or influenza tests are positive.   Seek medical care if you experience chest pain, shortness of breath, worsening fevers/chills, confusion.

## 2020-07-12 IMAGING — CT CT KNEE*R* W/O CM
3 of 4 series · 14 of 33 positions shown, 17 images · non-contrast
Comparison: X-ray 06/11/2019

CLINICAL DATA: Tibial plateau fracture

EXAM:
CT OF THE RIGHT KNEE WITHOUT CONTRAST
TECHNIQUE: Multidetector CT imaging of the right knee was performed according
to the standard protocol. Multiplanar CT image reconstructions were
also generated.

[Series 6: lower ext thin st · axial · 0.44mm/px · z∈[+302,+506]mm · 6 of 527 slices shown, 8 images]
[im 59/527  soft-tissue]
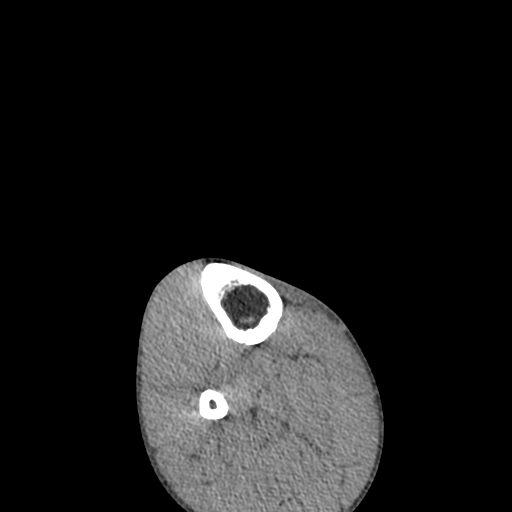
[im 59/527  bone]
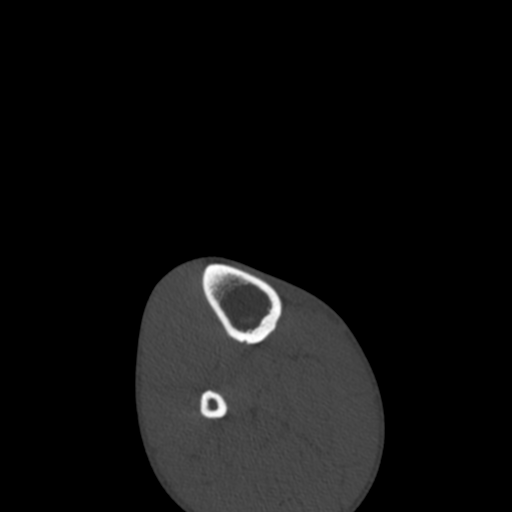
[im 176/527  bone]
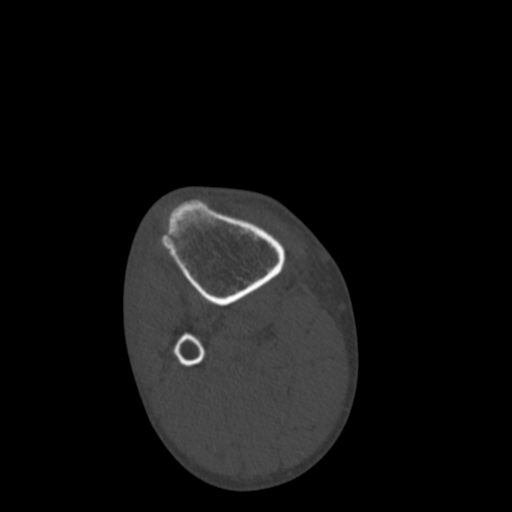
[im 234/527  bone]
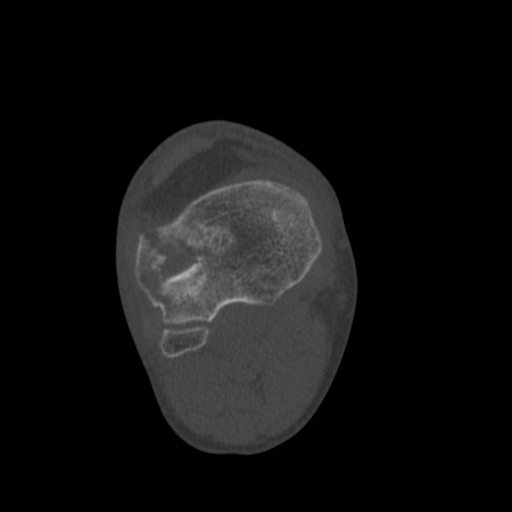
[im 293/527  bone]
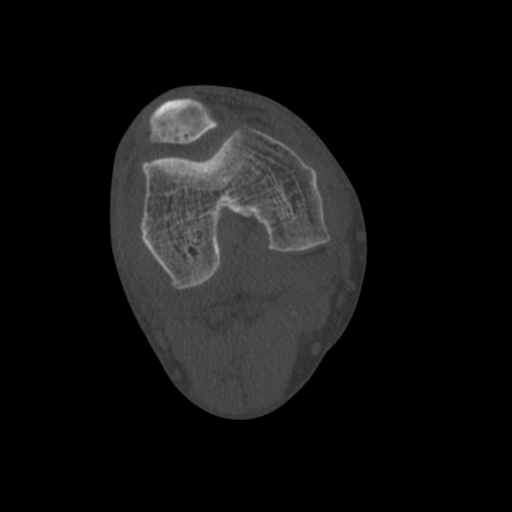
[im 410/527  soft-tissue]
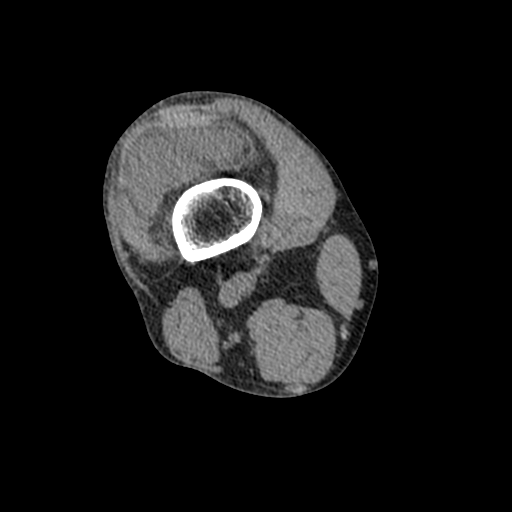
[im 410/527  bone]
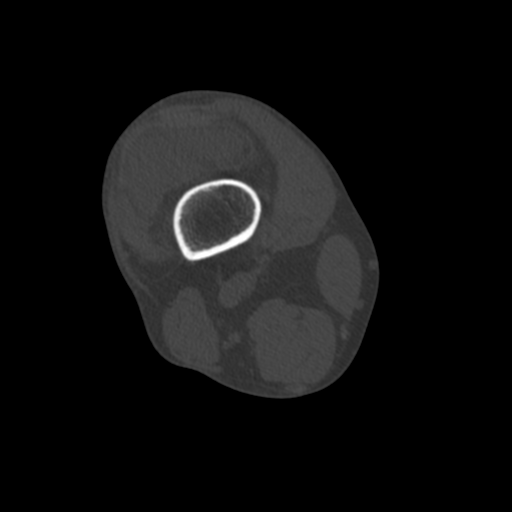
[im 468/527  bone]
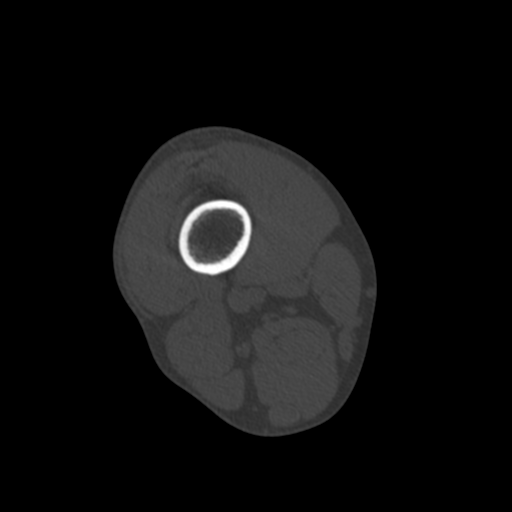

[Series 9: lower ext cor st · coronal · 0.36mm/px · 3 of 154 slices shown]
[im 31/154  bone]
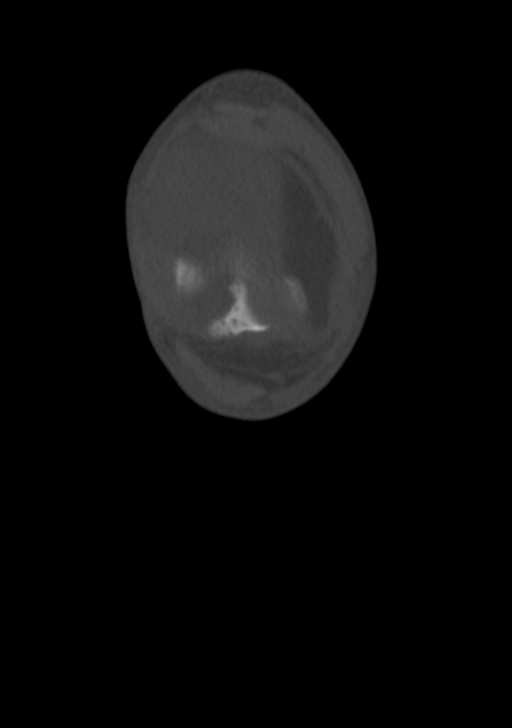
[im 62/154  bone]
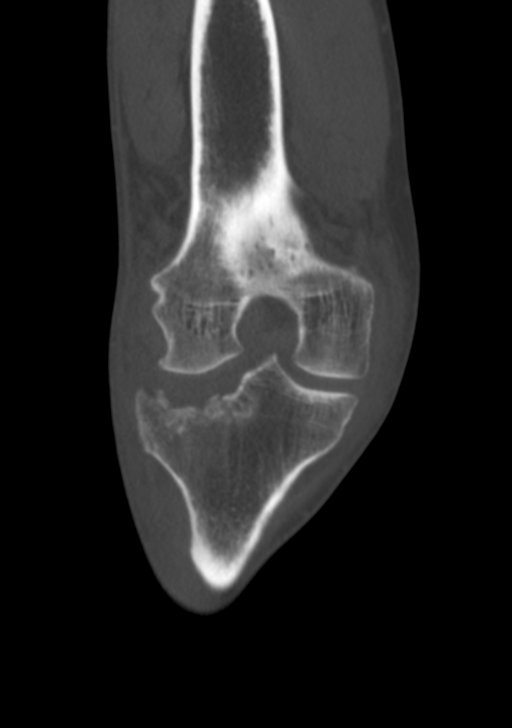
[im 92/154  bone]
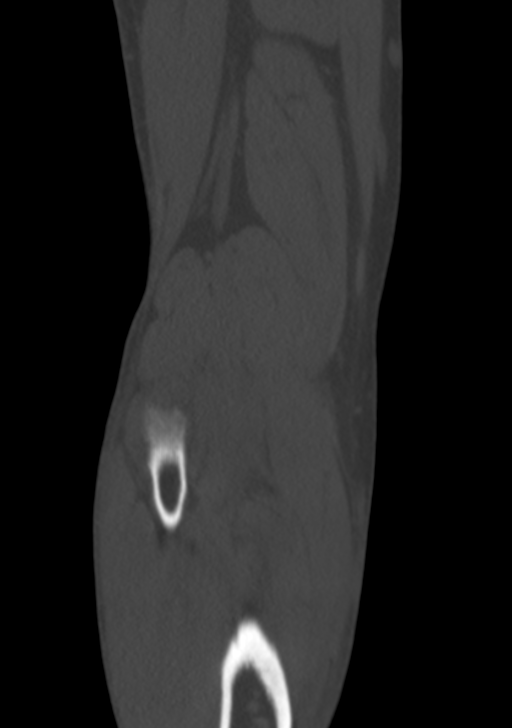

[Series 10: lower ext sag st · sagittal · 0.45mm/px · 5 of 109 slices shown, 6 images]
[im 37/109  bone]
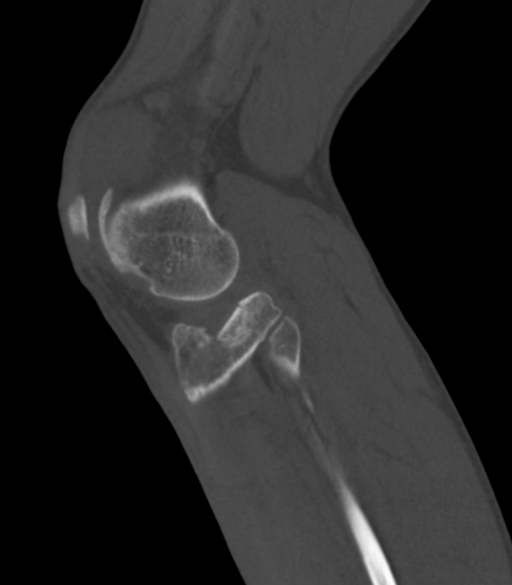
[im 46/109  bone]
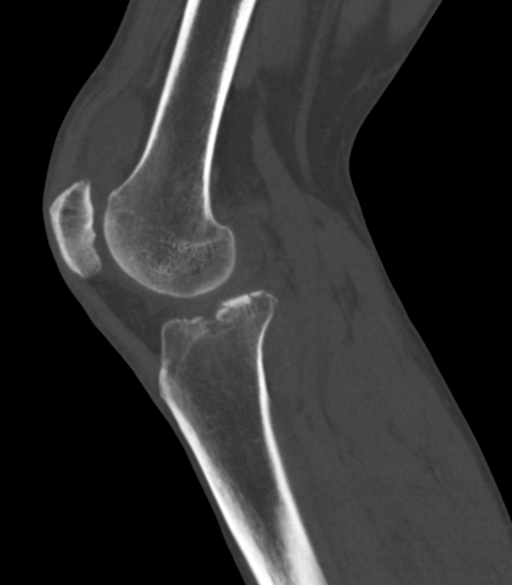
[im 55/109  soft-tissue]
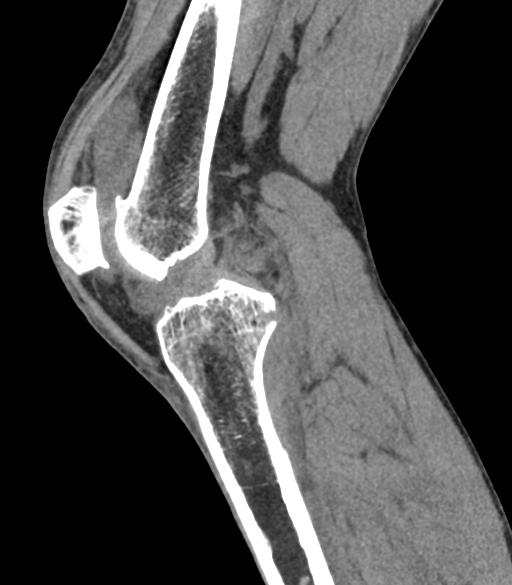
[im 55/109  bone]
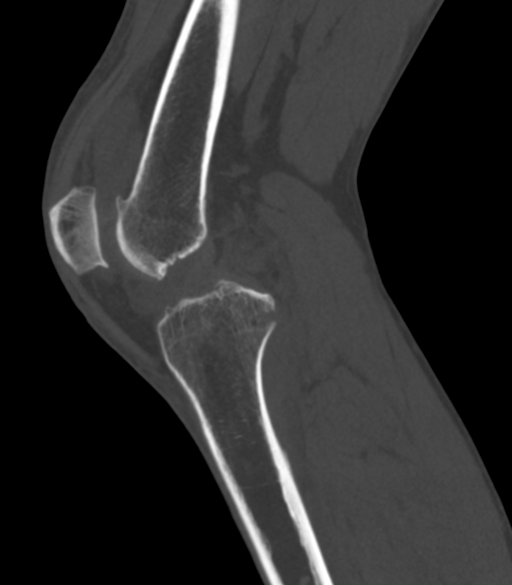
[im 64/109  bone]
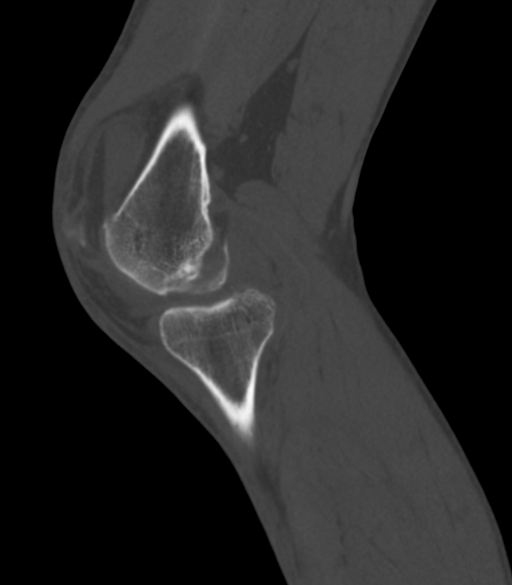
[im 73/109  bone]
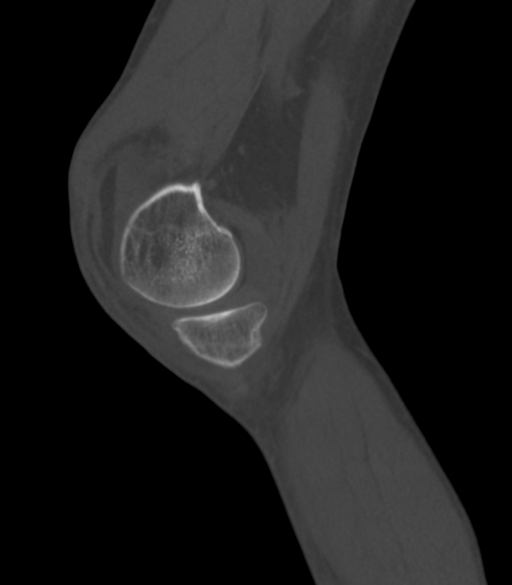

[14 of 33 positions shown; findings below may reference images not displayed]

FINDINGS: Bones/Joint/Cartilage

Acute comminuted fracture of the lateral tibial plateau with up to
12 mm of articular-surface depression (series 7, image 67). There is
a vertical component of the peripheral aspect of the lateral tibial
plateau extending into the proximal metaphysis without significant
displacement. Nondisplaced fracture component at the central aspect
of the posterior tibial plateau (series 3, image 90) near the tibial
attachment of the PCL. Nondisplaced fracture components involve the
lateral tibial spine. Fracture line closely approximates the
proximal tibiofibular joint without definite involvement.

There is a large knee joint lipohemarthrosis. Small amount of
fluid/blood products within a Baker's cyst.

The visualized proximal fibula, distal femur, and patella are intact
without fracture. There is a large subchondral cyst within the
lateral patellar facet.

Ligaments

Suboptimally assessed by CT.

Muscles and Tendons

Unremarkable muscle bulk. Tendinous structures about the knee appear
grossly intact.

Soft tissues

Soft tissue edema at the fracture site. There is edema and fluid
within Hoffa's fat.
IMPRESSION: 1. Acute comminuted fracture of the lateral tibial plateau with up
to 12 mm of articular-surface depression.
2. Nondisplaced fracture component at the central aspect of the
posterior tibial plateau near the tibial attachment of the PCL.
Correlate for associated ligamentous injury.
3. Large knee joint lipohemarthrosis. Small amount of fluid/blood
products within a Baker's cyst.
4. Large subchondral cyst within the lateral patellar facet.

## 2023-10-22 ENCOUNTER — Emergency Department (HOSPITAL_COMMUNITY): Payer: Self-pay

## 2023-10-22 ENCOUNTER — Encounter (HOSPITAL_COMMUNITY): Payer: Self-pay | Admitting: Radiology

## 2023-10-22 ENCOUNTER — Other Ambulatory Visit: Payer: Self-pay

## 2023-10-22 ENCOUNTER — Emergency Department (HOSPITAL_COMMUNITY)
Admission: EM | Admit: 2023-10-22 | Discharge: 2023-10-22 | Disposition: A | Discharge status: Home / Self Care | Payer: Self-pay | Attending: Emergency Medicine | Admitting: Emergency Medicine

## 2023-10-22 DIAGNOSIS — K4091 Unilateral inguinal hernia, without obstruction or gangrene, recurrent: Secondary | ICD-10-CM | POA: Insufficient documentation

## 2023-10-22 LAB — I-STAT CG4 LACTIC ACID, ED: Lactic Acid, Venous: 0.9 mmol/L (ref 0.5–1.9)

## 2023-10-22 LAB — CBC
HCT: 49.1 % (ref 39.0–52.0)
Hemoglobin: 16.2 g/dL (ref 13.0–17.0)
MCH: 29.1 pg (ref 26.0–34.0)
MCHC: 33 g/dL (ref 30.0–36.0)
MCV: 88.3 fL (ref 80.0–100.0)
Platelets: 238 10*3/uL (ref 150–400)
RBC: 5.56 MIL/uL (ref 4.22–5.81)
RDW: 14.4 % (ref 11.5–15.5)
WBC: 12 10*3/uL — ABNORMAL HIGH (ref 4.0–10.5)
nRBC: 0 % (ref 0.0–0.2)

## 2023-10-22 LAB — COMPREHENSIVE METABOLIC PANEL WITH GFR
ALT: 18 U/L (ref 0–44)
AST: 27 U/L (ref 15–41)
Albumin: 4.2 g/dL (ref 3.5–5.0)
Alkaline Phosphatase: 88 U/L (ref 38–126)
Anion gap: 13 (ref 5–15)
BUN: 6 mg/dL (ref 6–20)
CO2: 19 mmol/L — ABNORMAL LOW (ref 22–32)
Calcium: 9 mg/dL (ref 8.9–10.3)
Chloride: 107 mmol/L (ref 98–111)
Creatinine, Ser: 0.75 mg/dL (ref 0.61–1.24)
GFR, Estimated: >60 mL/min (ref >60)
Glucose, Bld: 164 mg/dL — ABNORMAL HIGH (ref 70–99)
Potassium: 4 mmol/L (ref 3.5–5.1)
Sodium: 139 mmol/L (ref 135–145)
Total Bilirubin: 0.4 mg/dL (ref 0.0–1.2)
Total Protein: 8 g/dL (ref 6.5–8.1)

## 2023-10-22 LAB — URINALYSIS, ROUTINE W REFLEX MICROSCOPIC
Bilirubin Urine: NEGATIVE
Glucose, UA: NEGATIVE mg/dL
Hgb urine dipstick: NEGATIVE
Ketones, ur: 5 mg/dL — AB
Leukocytes,Ua: NEGATIVE
Nitrite: NEGATIVE
Protein, ur: 100 mg/dL — AB
Specific Gravity, Urine: 1.023 (ref 1.005–1.030)
pH: 5 (ref 5.0–8.0)

## 2023-10-22 LAB — LIPASE, BLOOD: Lipase: 35 U/L (ref 11–51)

## 2023-10-22 MED ORDER — ONDANSETRON HCL 4 MG/2ML IJ SOLN
4.0000 mg | Freq: Once | INTRAMUSCULAR | Status: AC
  Administered 2023-10-22: 4 mg via INTRAVENOUS
  Filled 2023-10-22: qty 2

## 2023-10-22 MED ORDER — SODIUM CHLORIDE (PF) 0.9 % IJ SOLN
INTRAMUSCULAR | Status: AC
  Filled 2023-10-22: qty 50

## 2023-10-22 MED ORDER — IOHEXOL 300 MG/ML  SOLN
100.0000 mL | Freq: Once | INTRAMUSCULAR | Status: AC | PRN
  Administered 2023-10-22: 100 mL via INTRAVENOUS

## 2023-10-22 MED ORDER — MORPHINE SULFATE (PF) 4 MG/ML IV SOLN
4.0000 mg | Freq: Once | INTRAVENOUS | Status: AC
  Administered 2023-10-22: 4 mg via INTRAVENOUS
  Filled 2023-10-22: qty 1

## 2023-10-22 NOTE — ED Provider Notes (Signed)
 [place] EMERGENCY DEPARTMENT AT [facility] Provider Note   CSN: [id number] Arrival date & time: 10/22/23  4098     History  Chief Complaint  Patient presents with   Abdominal Pain    Steven Boyle is a 53 y.o. male.  Patient is a 53 year old male with past medical history of scrotal hernia presenting to the emergency department with abdominal pain.  He states that his hernia has been large and unable to push back in for several years.  He states just this morning he started to develop severe abdominal pain.  He states it felt sharp and stabbing and was diffuse.  He states he felt nauseous and vomited once.  He states that he had chills but no fevers.  He denies any diarrhea or constipation, states he is still passing gas.  Denies any prior abdominal surgeries.  The history is provided by the patient.  Abdominal Pain      Home Medications Prior to Admission medications   Medication Sig Start Date End Date Taking? Authorizing Provider  amoxicillin-clavulanate (AUGMENTIN) 875-125 MG tablet Take 1 tablet by mouth every 12 (twelve) hours. 04/20/20   Lamptey, Britta Mccreedy, MD  HYDROcodone-acetaminophen (NORCO) 5-325 MG tablet Take 1 tablet by mouth every 6 (six) hours as needed for moderate pain or severe pain. 04/20/20   LampteyBritta Mccreedy, MD  ibuprofen (ADVIL) 200 MG tablet Take 200 mg by mouth every 6 (six) hours as needed.    [provider]      Allergies    Patient has no known allergies.    Review of Systems   Review of Systems  Gastrointestinal:  Positive for abdominal pain.    Physical Exam Updated Vital Signs BP 114/89 (BP Location: Left Arm)   Pulse 73   Temp 98.2 F (36.8 C) (Oral)   Resp 20   Ht 5\' 11"  (1.803 m)   Wt 74.8 kg   SpO2 100%   BMI 23.01 kg/m  Physical Exam Vitals and nursing note reviewed.  Constitutional:      General: He is not in acute distress.    Appearance: He is well-developed.  HENT:     Head: Normocephalic and  atraumatic.     Mouth/Throat:     Mouth: Mucous membranes are moist.  Eyes:     Extraocular Movements: Extraocular movements intact.  Cardiovascular:     Rate and Rhythm: Normal rate and regular rhythm.     Heart sounds: Normal heart sounds.  Pulmonary:     Effort: Pulmonary effort is normal.     Breath sounds: Normal breath sounds.  Abdominal:     General: Abdomen is flat. Bowel sounds are decreased.     Palpations: Abdomen is soft.     Tenderness: There is generalized abdominal tenderness. There is no guarding or rebound.  Genitourinary:    Comments: Large scrotal hernia, non-reducible, non-tender to palpation, no overlying skin changes Skin:    General: Skin is warm and dry.  Neurological:     General: No focal deficit present.     Mental Status: He is alert and oriented to person, place, and time.  Psychiatric:        Mood and Affect: Mood normal.        Behavior: Behavior normal.     ED Results / Procedures / Treatments   Labs (all labs ordered are listed, but only abnormal results are displayed) Labs Reviewed  COMPREHENSIVE METABOLIC PANEL WITH GFR - Abnormal; Notable for  the following components:      Result Value   CO2 19 (*)    Glucose, Bld 164 (*)    All other components within normal limits  CBC - Abnormal; Notable for the following components:   WBC 12.0 (*)    All other components within normal limits  URINALYSIS, ROUTINE W REFLEX MICROSCOPIC - Abnormal; Notable for the following components:   Color, Urine AMBER (*)    APPearance HAZY (*)    Ketones, ur 5 (*)    Protein, ur 100 (*)    Bacteria, UA RARE (*)    All other components within normal limits  LIPASE, BLOOD  I-STAT CG4 LACTIC ACID, ED    EKG None  Radiology CT ABDOMEN PELVIS W CONTRAST Result Date: 10/22/2023 CLINICAL DATA:  Abdominal pain, acute, nonlocalized. EXAM: CT ABDOMEN AND PELVIS WITH CONTRAST TECHNIQUE: Multidetector CT imaging of the abdomen and pelvis was performed using the  standard protocol following bolus administration of intravenous contrast. RADIATION DOSE REDUCTION: This exam was performed according to the departmental dose-optimization program which includes automated exposure control, adjustment of the mA and/or kV according to patient size and/or use of iterative reconstruction technique. CONTRAST:  OMNIPAQUE IOHEXOL 300 MG/ML  SOLN COMPARISON:  None Available. FINDINGS: Lower chest: There is small left pleural effusion. There is an approximately 1.6 x 4.2 cm pleural based homogeneous opacity at the left lung base, posteriorly, favored to represent rounded atelectasis. Bilateral lungs are otherwise clear. No mass or consolidation. No right pleural effusion. Normal cardiac size. No pericardial effusion. Hepatobiliary: The liver is normal in size. Non-cirrhotic configuration. No suspicious mass. Note is made of a 5 mm hypoattenuating focus in the right hepatic lobe, segment 5, which is too small to adequately characterize. No intrahepatic or extrahepatic bile duct dilation. No calcified gallstones. Normal gallbladder wall thickness. No pericholecystic inflammatory changes. Pancreas: Unremarkable. No pancreatic ductal dilatation or surrounding inflammatory changes. Spleen: Within normal limits. No focal lesion. Adrenals/Urinary Tract: Adrenal glands are unremarkable. No suspicious renal mass. No hydronephrosis. No renal or ureteric calculi. Unremarkable urinary bladder. Stomach/Bowel: No disproportionate dilation of the small or large bowel loops. No evidence of abnormal bowel wall thickening or inflammatory changes. The appendix is unremarkable. Vascular/Lymphatic: There is mild fat stranding surrounding the mesenteric vessels in the left lower quadrant of abdomen. No ascites or pneumoperitoneum. No abdominal or pelvic lymphadenopathy, by size criteria. No aneurysmal dilation of the major abdominal arteries. 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 However, there is reconstitution of the left common femoral artery. There are moderate peripheral atherosclerotic vascular calcifications of the aorta and its major branches. Reproductive: Normal size prostate. Symmetric seminal vesicles. Other: There is large left inguinal hernia containing multiple unobstructed small bowel loops. There is mild stranding of the herniated fat. However, the attenuation of the walls of the herniated bowel loops is similar to the other bowel loops and not concerning for vascular compromise. There is a tiny fat containing umbilical hernia. The soft tissues and abdominal wall are otherwise unremarkable. Musculoskeletal: No suspicious osseous lesions. There are mild multilevel degenerative changes in the visualized spine. IMPRESSION: 1. There is large left inguinal hernia containing multiple unobstructed small bowel loops. There is mild stranding of the herniated fat. However, the attenuation of the walls of the herniated bowel loops is similar to the other bowel loops and not concerning for vascular compromise. 2. 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 However, there is reconstitution of the left common femoral artery. 3. Multiple other nonemergent observations, as described above. Aortic Atherosclerosis (ICD10-I70.0). Electronically Signed   By: Jules Schick M.D.   On: 10/22/2023 15:22    Procedures Procedures    Medications Ordered in ED Medications  ondansetron (ZOFRAN) injection 4 mg (4 mg Intravenous Given 10/22/23 1305)  morphine (PF) 4 MG/ML injection 4 mg (4 mg Intravenous Given 10/22/23 1306)  iohexol (OMNIPAQUE) 300 MG/ML solution 100 mL (100 mLs Intravenous Contrast Given 10/22/23 1451)    ED Course/ Medical Decision Making/ A&P Clinical Course as of 10/22/23 1606  Wed Oct 22, 2023  1549 CTAP with large L inguinal hernia, no signs of obstruction or vascular compromise.  [VK]  1604 Patient  reports his pain has resolved.  He is stable for discharge home and will be given outpatient general surgery follow-up. [VK]    Clinical Course User Index [VK] Rexford Maus, DO                                 Medical Decision Making This patient presents to the ED with chief complaint(s) of abdominal pain with pertinent past medical history of scrotal hernia which further complicates the presenting complaint. The complaint involves an extensive differential diagnosis and also carries with it a high risk of complications and morbidity.    The differential diagnosis includes incarcerated versus strangulated hernia, obstruction, other intra-abdominal infection, UTI less likely as no urinary symptoms  Additional history obtained: Additional history obtained from N/A Records reviewed N/A  ED Course and Reassessment: On patient's arrival he is hemodynamically stable in no acute distress.  Was initially evaluated in triage and had labs performed that showed mild leukocytosis and mildly low bicarb otherwise within normal range.  Patient will additionally have CT abdomen and pelvis to evaluate for etiology of his pain.  Will be given pain and nausea control and will be closely reassessed.  Independent labs interpretation:  The following labs were independently interpreted: mild leukocytosis, otherwise within normal range  Independent visualization of imaging: - I independently visualized the following imaging with scope of interpretation limited to determining acute life threatening conditions related to emergency care: CTAP, which revealed large inguinal hernia without evidence of obstruction or vascular compromise  Consultation: - Consulted or discussed management/test interpretation w/ external professional: N/A  Consideration for admission or further workup: Patient has no emergent conditions requiring admission or further work-up at this time and is stable for discharge home with  general surgery follow-up  Social Determinants of health: N/A    Amount and/or Complexity of Data Reviewed Labs: ordered. Radiology: ordered.  Risk Prescription drug management.          Final Clinical Impression(s) / ED Diagnoses Final diagnoses:  Unilateral recurrent inguinal hernia without obstruction or gangrene    Rx / DC Orders ED Discharge Orders     None         Rexford Maus, DO 10/22/23 1606

## 2023-10-22 NOTE — ED Triage Notes (Signed)
 Pt arrived with [facility] reporting abdominal pain that started this morning, generalized. Groin swelling that started this morning as well. Patient endorses having a hernia. States feels heavy. Reports one episode of vomiting this morning and chills. Pain 10/10.

## 2023-10-22 NOTE — Discharge Instructions (Signed)
 You were seen in the emergency department for your abdominal pain.  You do have a large hernia but had no signs of any blockages to your bowels or decreased blood flow to your intestines.  You can follow-up with general surgery as an outpatient to see if you may need to have your hernia repaired.  You can return to the emergency department if you develop severe pain, you have repetitive vomiting, your skin over your hernia turns red or black or if you have any other new or concerning symptoms.
